# Patient Record
Sex: Female | Born: 1980 | Race: White | Hispanic: No | Marital: Single | Smoking: Current some day smoker
Health system: Southern US, Community
[De-identification: ages and names within clinical notes are randomized; demographics above are authoritative.]

## PROBLEM LIST (undated history)

## (undated) DIAGNOSIS — N6452 Nipple discharge: Secondary | ICD-10-CM

## (undated) DIAGNOSIS — N63 Unspecified lump in unspecified breast: Secondary | ICD-10-CM

---

## 2016-03-06 DIAGNOSIS — N63 Unspecified lump in unspecified breast: Secondary | ICD-10-CM

## 2016-03-06 HISTORY — DX: Unspecified lump in unspecified breast: N63.0

## 2017-03-09 ENCOUNTER — Other Ambulatory Visit: Payer: Self-pay | Admitting: Family Medicine

## 2017-03-09 DIAGNOSIS — N6312 Unspecified lump in the right breast, upper inner quadrant: Secondary | ICD-10-CM

## 2017-03-09 DIAGNOSIS — N6324 Unspecified lump in the left breast, lower inner quadrant: Secondary | ICD-10-CM

## 2017-03-11 ENCOUNTER — Other Ambulatory Visit: Payer: Self-pay | Admitting: Family Medicine

## 2017-03-11 DIAGNOSIS — N644 Mastodynia: Secondary | ICD-10-CM

## 2017-03-11 DIAGNOSIS — N6312 Unspecified lump in the right breast, upper inner quadrant: Secondary | ICD-10-CM

## 2017-03-11 DIAGNOSIS — N6324 Unspecified lump in the left breast, lower inner quadrant: Secondary | ICD-10-CM

## 2017-03-13 ENCOUNTER — Ambulatory Visit
Admission: RE | Admit: 2017-03-13 | Discharge: 2017-03-13 | Disposition: A | Discharge status: Home / Self Care | Source: Ambulatory Visit | Attending: Family Medicine | Admitting: Family Medicine

## 2017-03-13 DIAGNOSIS — N6312 Unspecified lump in the right breast, upper inner quadrant: Secondary | ICD-10-CM

## 2017-03-13 DIAGNOSIS — N644 Mastodynia: Secondary | ICD-10-CM

## 2017-03-13 DIAGNOSIS — N6324 Unspecified lump in the left breast, lower inner quadrant: Secondary | ICD-10-CM

## 2017-03-13 HISTORY — DX: Nipple discharge: N64.52

## 2017-03-13 HISTORY — DX: Unspecified lump in unspecified breast: N63.0

## 2018-05-22 ENCOUNTER — Encounter (HOSPITAL_COMMUNITY): Payer: Self-pay | Admitting: Emergency Medicine

## 2018-05-22 ENCOUNTER — Emergency Department (HOSPITAL_COMMUNITY)
Admission: EM | Admit: 2018-05-22 | Discharge: 2018-05-22 | Disposition: A | Discharge status: Home / Self Care | Attending: Emergency Medicine | Admitting: Emergency Medicine

## 2018-05-22 DIAGNOSIS — F17210 Nicotine dependence, cigarettes, uncomplicated: Secondary | ICD-10-CM | POA: Insufficient documentation

## 2018-05-22 DIAGNOSIS — K047 Periapical abscess without sinus: Secondary | ICD-10-CM | POA: Insufficient documentation

## 2018-05-22 DIAGNOSIS — K029 Dental caries, unspecified: Secondary | ICD-10-CM | POA: Diagnosis not present

## 2018-05-22 DIAGNOSIS — K0889 Other specified disorders of teeth and supporting structures: Secondary | ICD-10-CM | POA: Diagnosis present

## 2018-05-22 MED ORDER — PENICILLIN V POTASSIUM 500 MG PO TABS: 500.0000 mg | ORAL_TABLET | Freq: Four times a day (QID) | ORAL | 0 refills | Status: AC

## 2018-05-22 MED ORDER — IBUPROFEN 800 MG PO TABS: 800.0000 mg | ORAL_TABLET | Freq: Three times a day (TID) | ORAL | 0 refills | Status: DC

## 2018-05-22 MED ORDER — KETOROLAC TROMETHAMINE 30 MG/ML IJ SOLN
30.0000 mg | Freq: Once | INTRAMUSCULAR | Status: AC
  Administered 2018-05-22: 30 mg via INTRAMUSCULAR
  Filled 2018-05-22: qty 1

## 2018-05-22 NOTE — ED Triage Notes (Addendum)
Patient to ED c/o dental pain - states she has fillings that have come off and nerves are exposed x 1 month - worsening this past week with headache. Denies fevers/chills.

## 2018-05-22 NOTE — Discharge Instructions (Addendum)
Ibuprofen for pain. Penicillin for possible infection. Follow up with a dentist as soon as able.

## 2018-05-22 NOTE — ED Provider Notes (Signed)
[facility] EMERGENCY DEPARTMENT Provider Note   CSN: [id number] Arrival date & time: 05/22/18  4098     History   Chief Complaint Chief Complaint  Patient presents with  . Dental Pain    HPI Angel Fernandez is a 37 y.o. female.  HPI Angel Fernandez is a 38 y.o. female presents to ED with complaint of teeth pain. Pt states that she has had severe pain in all 4 last molars for about a month.  States that the feelings a lot of the one in the left upper gum.  States right upper second molar is decayed and is painful as well.  States on the lower jaw, both last molars are painful.  She states that she lost dental insurance and  unable to follow-up with a dentist at this time.  She states she is unable to sleep due to pain and has not been going to work for a week.  She is taking ibuprofen, Goody's powder, BC powder with no improvement.  She denies any facial swelling.  She denies any fever or chills.  No other complaints.  She states that she has been taking her mom's Vicodin which also does not help.  States "Percocet works better for me."  Past Medical History:  Diagnosis Date  . Breast discharge years   creamyin coloronly when squeezed discharge for years   . Breast mass 03/06/2016   bilat breat masses x's one week     There are no active problems to display for this patient.   History reviewed. No pertinent surgical history.   OB History   None      Home Medications    Prior to Admission medications   Not on File    Family History No family history on file.  Social History Social History   Tobacco Use  . Smoking status: Current Some Day Smoker    Packs/day: 0.25    Types: Cigarettes  Substance Use Topics  . Alcohol use: Yes  . Drug use: Never     Allergies   Erythromycin and Latex   Review of Systems Review of Systems  Constitutional: Negative for chills and fever.  HENT: Positive for dental problem.   Respiratory: Negative for  cough, chest tightness and shortness of breath.   Cardiovascular: Negative for chest pain, palpitations and leg swelling.  Musculoskeletal: Negative for arthralgias, myalgias, neck pain and neck stiffness.  Skin: Negative for rash.  Neurological: Positive for headaches. Negative for dizziness and weakness.  All other systems reviewed and are negative.    Physical Exam Updated Vital Signs BP 134/78 (BP Location: Left Arm)   Pulse 65   Temp 98 F (36.7 C) (Oral)   Resp 16   LMP 04/25/2018   SpO2 98%   Physical Exam  Constitutional: She is oriented to person, place, and time. She appears well-developed and well-nourished. No distress.  HENT:  Head: Normocephalic.  Right upper second molar decayed almost at the gumline.  No surrounding gum swelling or inflammation.  Left upper first molar chipped with large dental caries.  No trismus.  No swelling under the tongue.  Tenderness to palpation to the left first upper molar, left lower second molar, right upper second molar, right lower second molar.  Eyes: Conjunctivae are normal.  Neck: Neck supple.  Cardiovascular: Normal rate, regular rhythm and normal heart sounds.  Pulmonary/Chest: Effort normal and breath sounds normal. No respiratory distress. She has no wheezes. She has no rales.  Abdominal: Soft.  Bowel sounds are normal. She exhibits no distension. There is no tenderness. There is no rebound.  Musculoskeletal: She exhibits no edema.  Neurological: She is alert and oriented to person, place, and time.  Skin: Skin is warm and dry.  Psychiatric: She has a normal mood and affect. Her behavior is normal.  Nursing note and vitals reviewed.    ED Treatments / Results  Labs (all labs ordered are listed, but only abnormal results are displayed) Labs Reviewed - No data to display  EKG None  Radiology No results found.  Procedures Procedures (including critical care time)  Medications Ordered in ED Medications  ketorolac  (TORADOL) 30 MG/ML injection 30 mg (has no administration in time range)     Initial Impression / Assessment and Plan / ED Course  I have reviewed the triage vital signs and the nursing notes.  Pertinent labs & imaging results that were available during my care of the patient were reviewed by me and considered in my medical decision making (see chart for details).     Patient in emergency department with dental pain.  On exam, no obvious dental infection, multiple tender teeth.  No facial swelling.  No trismus.  No swelling under the tongue.  Patient is requesting Percocet, stating that she has tried NSAIDs, and has tried her mom's Vicodin which has not helped.  She states tramadol does not help her either and has not in the past.  She states Percocet is the only thing that works.  I expect to her that we do not normally treat dental pain with opiates.  I will encourage her to continue taking Tylenol and Motrin for pain.  I will start her on antibiotic for possible occult infection.  I will give her referral to a dentist.  Vitals:   05/22/18 0659 05/22/18 0856  BP: (!) 133/92 134/78  Pulse: 68 65  Resp: 16 16  Temp: 98.3 F (36.8 C) 98 F (36.7 C)  TempSrc: Oral Oral  SpO2: 100% 98%     Final Clinical Impressions(s) / ED Diagnoses   Final diagnoses:  Dental infection  Dental caries    ED Discharge Orders        Ordered    penicillin v potassium (VEETID) 500 MG tablet  4 times daily     05/22/18 0906    ibuprofen (ADVIL,MOTRIN) 800 MG tablet  3 times daily     05/22/18 0906       Jaynie CrumbleKirichenko, Kian Gamarra, PA-C 05/22/18 [id number]    Gwyneth SproutPlunkett, Whitney, MD 05/24/18 2249

## 2018-05-24 ENCOUNTER — Emergency Department (HOSPITAL_COMMUNITY)
Admission: EM | Admit: 2018-05-24 | Discharge: 2018-05-24 | Disposition: A | Discharge status: Home / Self Care | Attending: Emergency Medicine | Admitting: Emergency Medicine

## 2018-05-24 ENCOUNTER — Encounter (HOSPITAL_COMMUNITY): Payer: Self-pay | Admitting: *Deleted

## 2018-05-24 DIAGNOSIS — K0889 Other specified disorders of teeth and supporting structures: Secondary | ICD-10-CM | POA: Diagnosis present

## 2018-05-24 DIAGNOSIS — K029 Dental caries, unspecified: Secondary | ICD-10-CM

## 2018-05-24 DIAGNOSIS — F17210 Nicotine dependence, cigarettes, uncomplicated: Secondary | ICD-10-CM | POA: Diagnosis not present

## 2018-05-24 MED ORDER — OXYCODONE-ACETAMINOPHEN 5-325 MG PO TABS: 1.0000 | ORAL_TABLET | ORAL | 0 refills | Status: AC | PRN

## 2018-05-24 MED ORDER — OXYCODONE-ACETAMINOPHEN 5-325 MG PO TABS
1.0000 | ORAL_TABLET | ORAL | Status: DC | PRN
  Administered 2018-05-24: 1 via ORAL
  Filled 2018-05-24: qty 1

## 2018-05-24 NOTE — ED Triage Notes (Signed)
Pt in c/o dental pain, seen for same two nights ago, pain in due to continued pain

## 2018-05-24 NOTE — ED Notes (Signed)
Sign pad not working verbalized understanding of discharge instructions.  

## 2018-05-24 NOTE — ED Provider Notes (Signed)
[facility]GENC[facility] Provider Note   CSN: [id number] Arrival date & time: 05/24/18  1127     History   Chief Complaint Chief Complaint  Patient presents with  . Dental Pain    HPI Angel Fernandez is a 37 y.o. female with no significant pmhx who presented to the ED today complaining of dental pain. Pt reports that she has pain in multiple teeth due to nerve root exposure and cavities. She states that she was seen in the ED 2 days ago for similar symptoms and was given a shot of toradol which was not helpful. She was also given prescription for penicillin which she picked up today and took her first dose. She states that she has called around to multiple free dental clinics but they are unable to see her. She had an appointment with an oral surgeon today and they told her that they could pull her teeth but pt reported that she was "not ready to do that yet". She states that she was told that she needed to see a general dentist if she wanted different medical management. She has been taking ibuprofen at home without relief. No reported fever, chills, dysphagia, sore throat, oral swelling.  HPI  Past Medical History:  Diagnosis Date  . Breast discharge years   creamyin coloronly when squeezed discharge for years   . Breast mass 03/06/2016   bilat breat masses x's one week     There are no active problems to display for this patient.   History reviewed. No pertinent surgical history.   OB History   None      Home Medications    Prior to Admission medications   Medication Sig Start Date End Date Taking? Authorizing Provider  ibuprofen (ADVIL,MOTRIN) 800 MG tablet Take 1 tablet (800 mg total) by mouth 3 (three) times daily. 05/22/18   Kirichenko, Lemont Fillersatyana, PA-C  oxyCODONE-acetaminophen (PERCOCET/ROXICET) 5-325 MG tablet Take 1 tablet by mouth every 4 (four) hours as needed for severe pain. 05/24/18   Dowless, Lelon MastSamantha Tripp, PA-C  penicillin v potassium  (VEETID) 500 MG tablet Take 1 tablet (500 mg total) by mouth 4 (four) times daily for 7 days. 05/22/18 05/29/18  Jaynie CrumbleKirichenko, Tatyana, PA-C    Family History History reviewed. No pertinent family history.  Social History Social History   Tobacco Use  . Smoking status: Current Some Day Smoker    Packs/day: 0.25    Types: Cigarettes  . Smokeless tobacco: Current User  Substance Use Topics  . Alcohol use: Yes  . Drug use: Never     Allergies   Erythromycin and Latex   Review of Systems Review of Systems  All other systems reviewed and are negative.    Physical Exam Updated Vital Signs BP 138/88   Pulse 78   Temp 98.3 F (36.8 C) (Oral)   Resp 18   LMP 04/25/2018   SpO2 100%   Physical Exam  Constitutional: She is oriented to person, place, and time. She appears well-developed and well-nourished. No distress.  HENT:  Head: Normocephalic and atraumatic.  Mouth/Throat: Mucous membranes are normal. Abnormal dentition. Dental caries present. No dental abscesses. No oropharyngeal exudate or tonsillar abscesses.  Multiple dental caries without obvious dental abscess  Eyes: Conjunctivae are normal. Right eye exhibits no discharge. Left eye exhibits no discharge. No scleral icterus.  Cardiovascular: Normal rate.  Pulmonary/Chest: Effort normal.  Neurological: She is alert and oriented to person, place, and time. Coordination normal.  Skin: Skin is  warm and dry. No rash noted. She is not diaphoretic. No erythema. No pallor.  Psychiatric: She has a normal mood and affect. Her behavior is normal.  Nursing note and vitals reviewed.    ED Treatments / Results  Labs (all labs ordered are listed, but only abnormal results are displayed) Labs Reviewed - No data to display  EKG None  Radiology No results found.  Procedures Procedures (including critical care time)  Medications Ordered in ED Medications  oxyCODONE-acetaminophen (PERCOCET/ROXICET) 5-325 MG per tablet 1  tablet (1 tablet Oral Given 05/24/18 1222)     Initial Impression / Assessment and Plan / ED Course  I have reviewed the triage vital signs and the nursing notes.  Pertinent labs & imaging results that were available during my care of the patient were reviewed by me and considered in my medical decision making (see chart for details).    Patient with toothache in multiple teeth due to dental aries. No gross abscess. Exam unconcerning for Ludwig's angina or spread of infection. Pt has prescription for penicillin that she started today. I will give her another referral to a general family dentist. Few doses of oral pain medicine given. Return precautions outlined in patient discharge instructions.    Final Clinical Impressions(s) / ED Diagnoses   Final diagnoses:  Pain due to dental caries    ED Discharge Orders        Ordered    oxyCODONE-acetaminophen (PERCOCET/ROXICET) 5-325 MG tablet  Every 4 hours PRN     05/24/18 1310       Dowless, [place], PA-C 05/24/18 1518    Gerhard Munch, MD 05/24/18 1622

## 2018-05-24 NOTE — Discharge Instructions (Signed)
Follow up with any adult dentist as listed below. Take percocet as needed for pain as well as penicillin given during last visit. Return to the ED if you experience severe worsening of your symptoms, fever, difficulty swallowing.

## 2018-05-24 NOTE — ED Notes (Signed)
Patient brought discharge paper work with and stated called multiple placed and unable to get appointment today or tomorrow.

## 2018-06-16 ENCOUNTER — Other Ambulatory Visit: Payer: Self-pay

## 2018-06-16 ENCOUNTER — Emergency Department (HOSPITAL_COMMUNITY)
Admission: EM | Admit: 2018-06-16 | Discharge: 2018-06-16 | Disposition: A | Discharge status: Home / Self Care | Attending: Emergency Medicine | Admitting: Emergency Medicine

## 2018-06-16 ENCOUNTER — Emergency Department (HOSPITAL_COMMUNITY)

## 2018-06-16 DIAGNOSIS — F17210 Nicotine dependence, cigarettes, uncomplicated: Secondary | ICD-10-CM | POA: Diagnosis not present

## 2018-06-16 DIAGNOSIS — Y929 Unspecified place or not applicable: Secondary | ICD-10-CM | POA: Diagnosis not present

## 2018-06-16 DIAGNOSIS — W2209XA Striking against other stationary object, initial encounter: Secondary | ICD-10-CM | POA: Diagnosis not present

## 2018-06-16 DIAGNOSIS — Z23 Encounter for immunization: Secondary | ICD-10-CM | POA: Diagnosis not present

## 2018-06-16 DIAGNOSIS — S0101XA Laceration without foreign body of scalp, initial encounter: Secondary | ICD-10-CM | POA: Diagnosis not present

## 2018-06-16 DIAGNOSIS — R42 Dizziness and giddiness: Secondary | ICD-10-CM | POA: Diagnosis present

## 2018-06-16 DIAGNOSIS — Y939 Activity, unspecified: Secondary | ICD-10-CM | POA: Diagnosis not present

## 2018-06-16 DIAGNOSIS — Z79899 Other long term (current) drug therapy: Secondary | ICD-10-CM | POA: Insufficient documentation

## 2018-06-16 DIAGNOSIS — Y999 Unspecified external cause status: Secondary | ICD-10-CM | POA: Diagnosis not present

## 2018-06-16 DIAGNOSIS — S0191XA Laceration without foreign body of unspecified part of head, initial encounter: Secondary | ICD-10-CM

## 2018-06-16 MED ORDER — TETANUS-DIPHTH-ACELL PERTUSSIS 5-2.5-18.5 LF-MCG/0.5 IM SUSP
0.5000 mL | Freq: Once | INTRAMUSCULAR | Status: AC
  Administered 2018-06-16: 0.5 mL via INTRAMUSCULAR
  Filled 2018-06-16: qty 0.5

## 2018-06-16 MED ORDER — KETOROLAC TROMETHAMINE 30 MG/ML IJ SOLN
30.0000 mg | Freq: Once | INTRAMUSCULAR | Status: AC
  Administered 2018-06-16: 30 mg via INTRAMUSCULAR
  Filled 2018-06-16: qty 1

## 2018-06-16 MED ORDER — LIDOCAINE-EPINEPHRINE (PF) 2 %-1:200000 IJ SOLN
20.0000 mL | Freq: Once | INTRAMUSCULAR | Status: AC
  Administered 2018-06-16: 20 mL
  Filled 2018-06-16: qty 20

## 2018-06-16 MED ORDER — ETODOLAC 300 MG PO CAPS: 300.0000 mg | ORAL_CAPSULE | Freq: Three times a day (TID) | ORAL | 0 refills | Status: AC

## 2018-06-16 MED ORDER — IBUPROFEN 200 MG PO TABS
600.0000 mg | ORAL_TABLET | Freq: Once | ORAL | Status: DC
  Filled 2018-06-16: qty 3

## 2018-06-16 NOTE — Discharge Instructions (Addendum)
Staple removal in 7 to 10 days.

## 2018-06-16 NOTE — ED Notes (Signed)
Patient transported to X-ray 

## 2018-06-16 NOTE — ED Provider Notes (Signed)
[facility]-EMERGENCY DEPT Provider Note   CSN: [id number] Arrival date & time: 06/16/18  1657     History   Chief Complaint Chief Complaint  Patient presents with  . Head Laceration  . Dizziness    HPI Angel Fernandez is a 37 y.o. female.  HPI Patient presented to the emergency room for evaluation of a head injury.  Patient states she was standing up today when she struck her head on an open cabinet.  Patient sustained a laceration on top of her head.  She had a brief episode of loss of consciousness and fell to the ground.  Patient is now complaining of pain in her head and her neck.  She also has some pain in her shoulder but that has been a more chronic issue per.  Patient also states she is having continued dental pain.  She was seen previously and started on antibiotics.  She was referred to a dentist.  She denies any fevers or chills. Past Medical History:  Diagnosis Date  . Breast discharge years   creamyin coloronly when squeezed discharge for years   . Breast mass 03/06/2016   bilat breat masses x's one week     There are no active problems to display for this patient.   No past surgical history on file.   OB History   None      Home Medications    Prior to Admission medications   Medication Sig Start Date End Date Taking? Authorizing Provider  divalproex (DEPAKOTE) 500 MG DR tablet Take 1,000 mg by mouth 2 (two) times daily.   Yes [provider]  traMADol (ULTRAM) 50 MG tablet Take 50 mg by mouth every 6 (six) hours as needed for pain. 08/16/15  Yes [provider]  etodolac (LODINE) 300 MG capsule Take 1 capsule (300 mg total) by mouth every 8 (eight) hours. 06/16/18   Linwood Dibbles, MD  oxyCODONE-acetaminophen (PERCOCET/ROXICET) 5-325 MG tablet Take 1 tablet by mouth every 4 (four) hours as needed for severe pain. 05/24/18   Dowless, Lester Kinsman, PA-C    Family History No family history on file.  Social  History Social History   Tobacco Use  . Smoking status: Current Some Day Smoker    Packs/day: 0.25    Types: Cigarettes  . Smokeless tobacco: Current User  Substance Use Topics  . Alcohol use: Yes  . Drug use: Never     Allergies   Erythromycin; Latex; and Cleocin [clindamycin]   Review of Systems Review of Systems  All other systems reviewed and are negative.    Physical Exam Updated Vital Signs BP (!) 137/93   Pulse 69   Temp 98.8 F (37.1 C) (Oral)   Resp (!) 26   Ht 1.6 m (5\' 3" )   Wt 70.8 kg (156 lb)   SpO2 100%   BMI 27.63 kg/m   Physical Exam  HENT:  Head: Normocephalic.  Right Ear: External ear normal.  Left Ear: External ear normal.  Laceration scalp  Eyes: Conjunctivae are normal. Right eye exhibits no discharge. Left eye exhibits no discharge. No scleral icterus.  Neck: Neck supple. No tracheal deviation present.  Cardiovascular: Normal rate, regular rhythm and intact distal pulses.  Pulmonary/Chest: Effort normal and breath sounds normal. No stridor. No respiratory distress. She has no wheezes. She has no rales.  Abdominal: Soft. Bowel sounds are normal. She exhibits no distension. There is no tenderness. There is no rebound and no guarding.  Musculoskeletal: She  exhibits tenderness. She exhibits no edema.       Right shoulder: She exhibits tenderness.  Neurological: She is alert. She has normal strength. No cranial nerve deficit (no facial droop, extraocular movements intact, no slurred speech) or sensory deficit. She exhibits normal muscle tone. She displays no seizure activity. Coordination normal.  Skin: Skin is warm and dry. No rash noted. She is not diaphoretic.  Psychiatric: She has a normal mood and affect.  Nursing note and vitals reviewed.    ED Treatments / Results  Labs (all labs ordered are listed, but only abnormal results are displayed) Labs Reviewed - No data to display  EKG None  Radiology Dg Shoulder Right  Result Date:  06/16/2018 CLINICAL DATA:  Shoulder injury, pain, initial encounter. EXAM: RIGHT SHOULDER - 2+ VIEW COMPARISON:  None. FINDINGS: No acute osseous or joint abnormality. Visualized portion of the right chest is unremarkable. IMPRESSION: Negative. Electronically Signed   By: Leanna Battles M.D.   On: 06/16/2018 18:06   Ct Head Wo Contrast  Result Date: 06/16/2018 CLINICAL DATA:  Head laceration and dizziness. Patient hit head against the cabinet. Questionable loss of consciousness. EXAM: CT HEAD WITHOUT CONTRAST CT CERVICAL SPINE WITHOUT CONTRAST TECHNIQUE: Multidetector CT imaging of the head and cervical spine was performed following the standard protocol without intravenous contrast. Multiplanar CT image reconstructions of the cervical spine were also generated. COMPARISON:  None. FINDINGS: CT HEAD FINDINGS BRAIN: The ventricles and sulci are normal. No intraparenchymal hemorrhage, mass effect nor midline shift. No acute large vascular territory infarcts. No abnormal extra-axial fluid collections. Basal cisterns are midline and not effaced. No acute cerebellar abnormality. VASCULAR: Unremarkable. SKULL/SOFT TISSUES: No skull fracture. No significant soft tissue swelling. ORBITS/SINUSES: The included ocular globes and orbital contents are normal.Moderate ethmoid sinus mucosal thickening. Polypoid mucosal thickening of the right maxillary antrum with mild circumferential mucosal thickening of the left maxillary sinus. Mastoids are clear. OTHER: Scalp laceration with associated mild soft tissue swelling overlying the vertex of the skull posteriorly. CT CERVICAL SPINE FINDINGS ALIGNMENT: Vertebral bodies in alignment. Slight reversal cervical lordosis which may be on the basis of muscle spasm or patient positioning. SKULL BASE AND VERTEBRAE: Cervical vertebral bodies and posterior elements are intact. Intervertebral disc heights preserved. No destructive bony lesions. C1-2 articulation maintained. SOFT TISSUES AND  SPINAL CANAL: Normal. DISC LEVELS: No significant osseous canal stenosis or neural foraminal narrowing. UPPER CHEST: Lung apices are clear. OTHER: None. IMPRESSION: 1. Scalp laceration near the vertex of the skull without underlying fracture. No acute intracranial abnormality. 2. No acute cervical spine fracture or posttraumatic listhesis. Electronically Signed   By: Tollie Eth M.D.   On: 06/16/2018 18:26   Ct Cervical Spine Wo Contrast  Result Date: 06/16/2018 CLINICAL DATA:  Head laceration and dizziness. Patient hit head against the cabinet. Questionable loss of consciousness. EXAM: CT HEAD WITHOUT CONTRAST CT CERVICAL SPINE WITHOUT CONTRAST TECHNIQUE: Multidetector CT imaging of the head and cervical spine was performed following the standard protocol without intravenous contrast. Multiplanar CT image reconstructions of the cervical spine were also generated. COMPARISON:  None. FINDINGS: CT HEAD FINDINGS BRAIN: The ventricles and sulci are normal. No intraparenchymal hemorrhage, mass effect nor midline shift. No acute large vascular territory infarcts. No abnormal extra-axial fluid collections. Basal cisterns are midline and not effaced. No acute cerebellar abnormality. VASCULAR: Unremarkable. SKULL/SOFT TISSUES: No skull fracture. No significant soft tissue swelling. ORBITS/SINUSES: The included ocular globes and orbital contents are normal.Moderate ethmoid sinus mucosal thickening. Polypoid mucosal thickening  of the right maxillary antrum with mild circumferential mucosal thickening of the left maxillary sinus. Mastoids are clear. OTHER: Scalp laceration with associated mild soft tissue swelling overlying the vertex of the skull posteriorly. CT CERVICAL SPINE FINDINGS ALIGNMENT: Vertebral bodies in alignment. Slight reversal cervical lordosis which may be on the basis of muscle spasm or patient positioning. SKULL BASE AND VERTEBRAE: Cervical vertebral bodies and posterior elements are intact.  Intervertebral disc heights preserved. No destructive bony lesions. C1-2 articulation maintained. SOFT TISSUES AND SPINAL CANAL: Normal. DISC LEVELS: No significant osseous canal stenosis or neural foraminal narrowing. UPPER CHEST: Lung apices are clear. OTHER: None. IMPRESSION: 1. Scalp laceration near the vertex of the skull without underlying fracture. No acute intracranial abnormality. 2. No acute cervical spine fracture or posttraumatic listhesis. Electronically Signed   By: Tollie Ethavid  Kwon M.D.   On: 06/16/2018 18:26    Procedures .Marland Kitchen.Laceration Repair Date/Time: 06/16/2018 7:11 PM Performed by: Linwood DibblesKnapp, Sanayah Munro, MD Authorized by: Linwood DibblesKnapp, Jaice Lague, MD   Consent:    Consent obtained:  Verbal   Consent given by:  Patient   Risks discussed:  Infection, need for additional repair, pain, poor cosmetic result and poor wound healing   Alternatives discussed:  No treatment and delayed treatment Universal protocol:    Procedure explained and questions answered to patient or proxy's satisfaction: yes     Relevant documents present and verified: yes     Test results available and properly labeled: yes     Imaging studies available: yes     Required blood products, implants, devices, and special equipment available: yes     Site/side marked: yes     Immediately prior to procedure, a time out was called: yes     Patient identity confirmed:  Verbally with patient Anesthesia (see MAR for exact dosages):    Anesthesia method:  Local infiltration Laceration details:    Location:  Scalp   Scalp location:  Mid-scalp   Length (cm):  2 Repair type:    Repair type:  Simple Pre-procedure details:    Preparation:  Patient was prepped and draped in usual sterile fashion Exploration:    Wound extent: no areolar tissue violation noted, no fascia violation noted, no foreign bodies/material noted, no muscle damage noted, no nerve damage noted, no tendon damage noted, no underlying fracture noted and no vascular damage noted      Contaminated: no   Treatment:    Area cleansed with:  Shur-Clens   Amount of cleaning:  Standard   Irrigation method:  Pressure wash   Visualized foreign bodies/material removed: no   Skin repair:    Repair method:  Staples   Number of staples:  4 Approximation:    Approximation:  Close Post-procedure details:    Dressing:  Open (no dressing)   Patient tolerance of procedure:  Tolerated well, no immediate complications   (including critical care time)  Medications Ordered in ED Medications  ibuprofen (ADVIL,MOTRIN) tablet 600 mg (600 mg Oral Refused 06/16/18 1909)  lidocaine-EPINEPHrine (XYLOCAINE W/EPI) 2 %-1:200000 (PF) injection 20 mL (20 mLs Infiltration Given 06/16/18 1847)  ketorolac (TORADOL) 30 MG/ML injection 30 mg (30 mg Intramuscular Given 06/16/18 1852)     Initial Impression / Assessment and Plan / ED Course  I have reviewed the triage vital signs and the nursing notes.  Pertinent labs & imaging results that were available during my care of the patient were reviewed by me and considered in my medical decision making (see chart for details).  Patient presented to the emergency room for evaluation of a head injury associated with a syncopal event.  Patient had a laceration on the top of her scalp.  CT scans were negative for acute injury.  Patient also mentioned having some shoulder pain that was actually preceding this event.  X-rays were performed no acute injuries noted.  Patient's laceration was repaired without difficulty.  At this time there does not appear to be any evidence of an acute emergency medical condition and the patient appears stable for discharge with appropriate outpatient follow up.   Final Clinical Impressions(s) / ED Diagnoses   Final diagnoses:  Laceration of head without foreign body, unspecified part of head, initial encounter    ED Discharge Orders        Ordered    etodolac (LODINE) 300 MG capsule  Every 8 hours    Note to Pharmacy:   As needed for pain   06/16/18 1909       Linwood Dibbles, MD 06/16/18 1912

## 2018-06-16 NOTE — ED Triage Notes (Signed)
Pt to ED from home with c/o of head laceration and dizziness. Pt was bending down at work and the cabinet above her was open, as she stood up she hit her head on the cabinet. Pt states she thinks she lost consciousness because she just remembers waking up in the fetal position. Pt states she is feeling nausea and has "thrown up in her mouth 3 x" Pt also states she has a few nerves exposed in her mouth for dental issues and on antibiotics for 3 weeks.

## 2019-09-04 IMAGING — CT CT CERVICAL SPINE W/O CM
4 of 7 series · 13 of 33 positions shown, 14 images · non-contrast
Comparison: None.

CLINICAL DATA: Head laceration and dizziness. Patient hit head
against the cabinet. Questionable loss of consciousness.

EXAM:
CT HEAD WITHOUT CONTRAST
CT CERVICAL SPINE WITHOUT CONTRAST
TECHNIQUE: Multidetector CT imaging of the head and cervical spine was
performed following the standard protocol without intravenous
contrast. Multiplanar CT image reconstructions of the cervical spine
were also generated.

[Series 8: c spine soft · axial · 0.29mm/px · z∈[-218,-118]mm · 4 of 84 slices shown]
[im 17/84  soft-tissue]
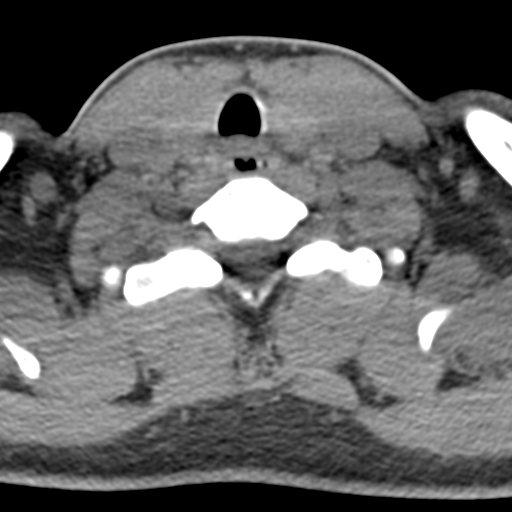
[im 34/84  soft-tissue]
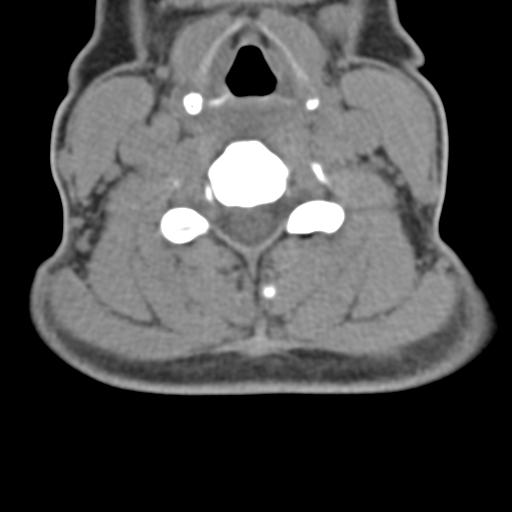
[im 50/84  soft-tissue]
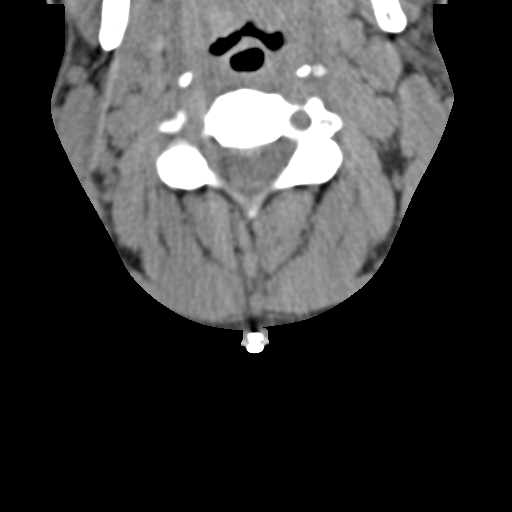
[im 67/84  soft-tissue]
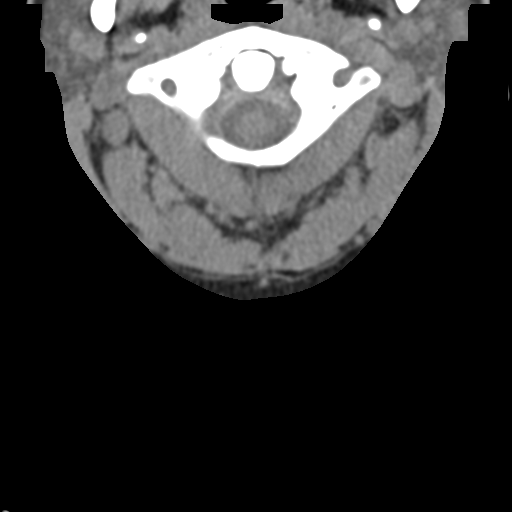

[Series 10: orthogonal bone · axial · 0.23mm/px · z∈[-223,-133]mm · 4 of 82 slices shown, 5 images]
[im 17/82  soft-tissue]
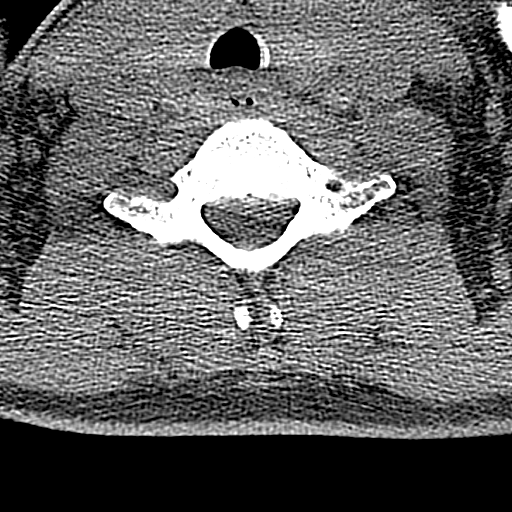
[im 17/82  bone]
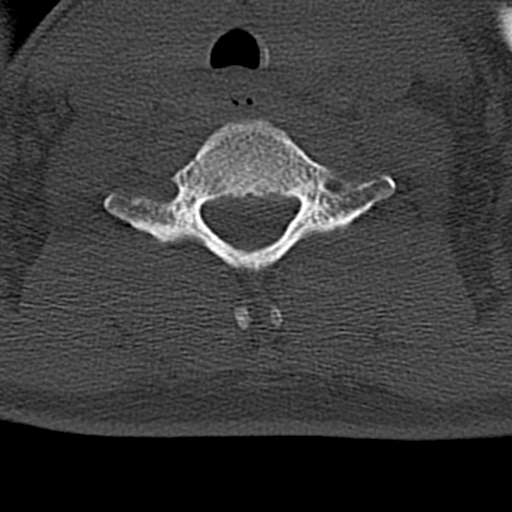
[im 33/82  bone]
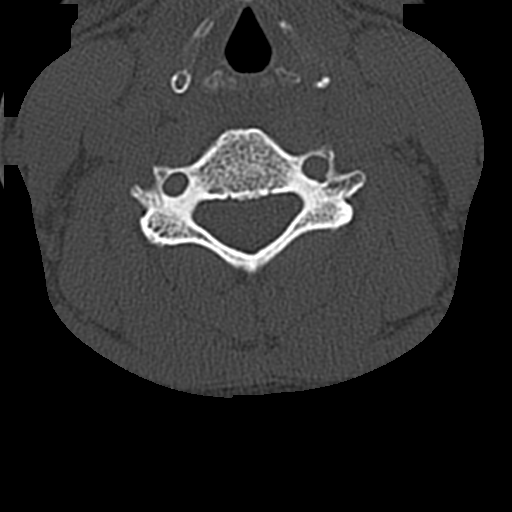
[im 49/82  bone]
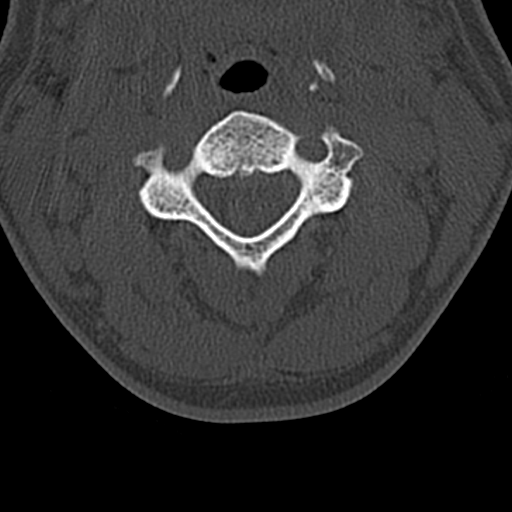
[im 65/82  bone]
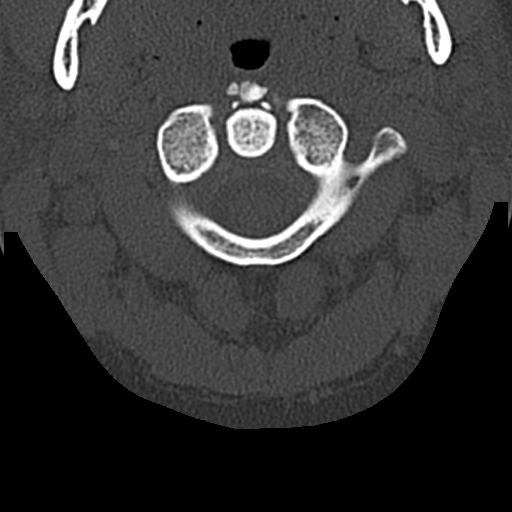

[Series 11: coronal bone · coronal · 0.24mm/px · 1 of 61 slices shown]
[im 31/61  bone]
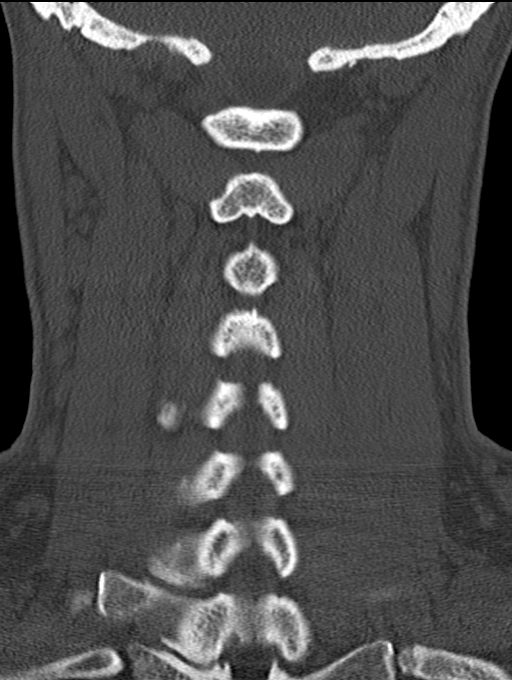

[Series 12: sagittal bone · sagittal · 0.24mm/px · 4 of 61 slices shown]
[im 13/61  bone]
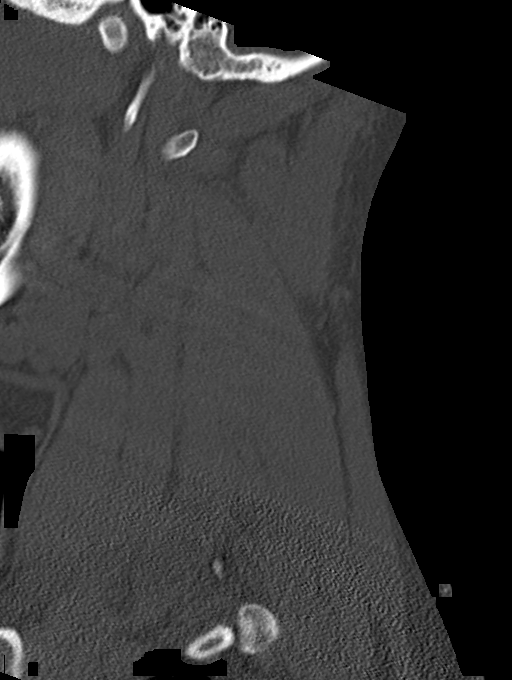
[im 25/61  bone]
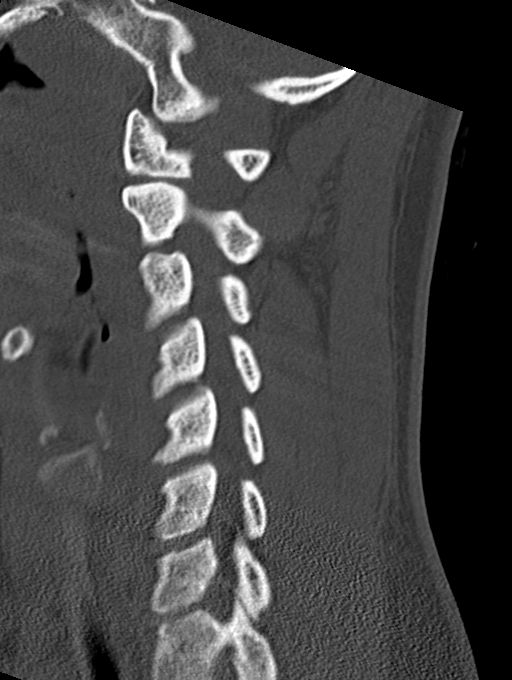
[im 37/61  bone]
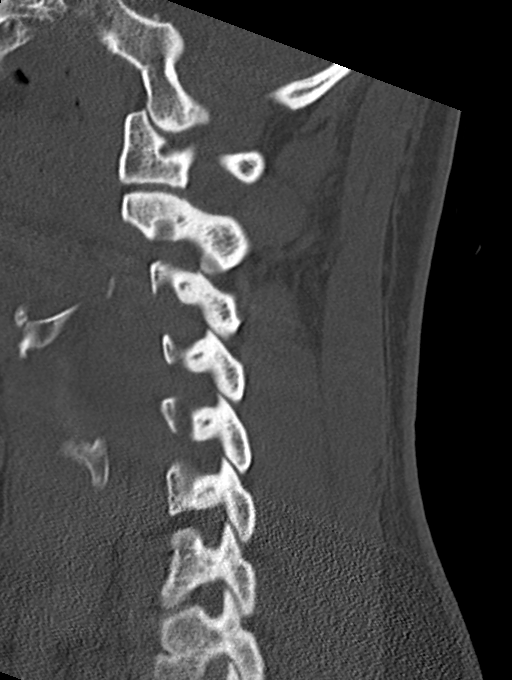
[im 49/61  bone]
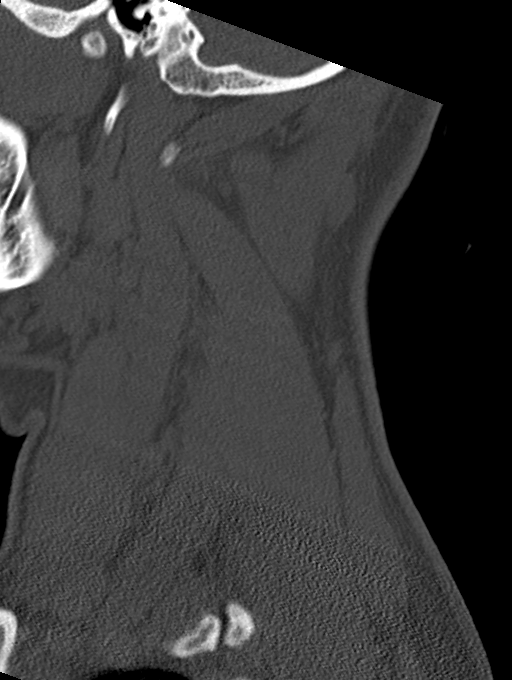

[13 of 33 positions shown; findings below may reference images not displayed]

FINDINGS: CT HEAD FINDINGS

BRAIN: The ventricles and sulci are normal. No intraparenchymal
hemorrhage, mass effect nor midline shift. No acute large vascular
territory infarcts. No abnormal extra-axial fluid collections. Basal
cisterns are midline and not effaced. No acute cerebellar
abnormality.

VASCULAR: Unremarkable.

SKULL/SOFT TISSUES: No skull fracture. No significant soft tissue
swelling.

ORBITS/SINUSES: The included ocular globes and orbital contents are
normal.Moderate ethmoid sinus mucosal thickening. Polypoid mucosal
thickening of the right maxillary antrum with mild circumferential
mucosal thickening of the left maxillary sinus. Mastoids are clear.

OTHER: Scalp laceration with associated mild soft tissue swelling
overlying the vertex of the skull posteriorly.

CT CERVICAL SPINE FINDINGS

ALIGNMENT: Vertebral bodies in alignment. Slight reversal cervical
lordosis which may be on the basis of muscle spasm or patient
positioning.

SKULL BASE AND VERTEBRAE: Cervical vertebral bodies and posterior
elements are intact. Intervertebral disc heights preserved. No
destructive bony lesions. C1-2 articulation maintained.

SOFT TISSUES AND SPINAL CANAL: Normal.

DISC LEVELS: No significant osseous canal stenosis or neural
foraminal narrowing.

UPPER CHEST: Lung apices are clear.

OTHER: None.
IMPRESSION: 1. Scalp laceration near the vertex of the skull without underlying
fracture. No acute intracranial abnormality.
2. No acute cervical spine fracture or posttraumatic listhesis.

## 2019-09-04 IMAGING — CR DG SHOULDER 2+V*R*
3 series · 3 of 3 positions shown · non-contrast
Comparison: None.

CLINICAL DATA: Shoulder injury, pain, initial encounter.

EXAM:
RIGHT SHOULDER - 2+ VIEW

[x shoulder ap right (1 of 2)]
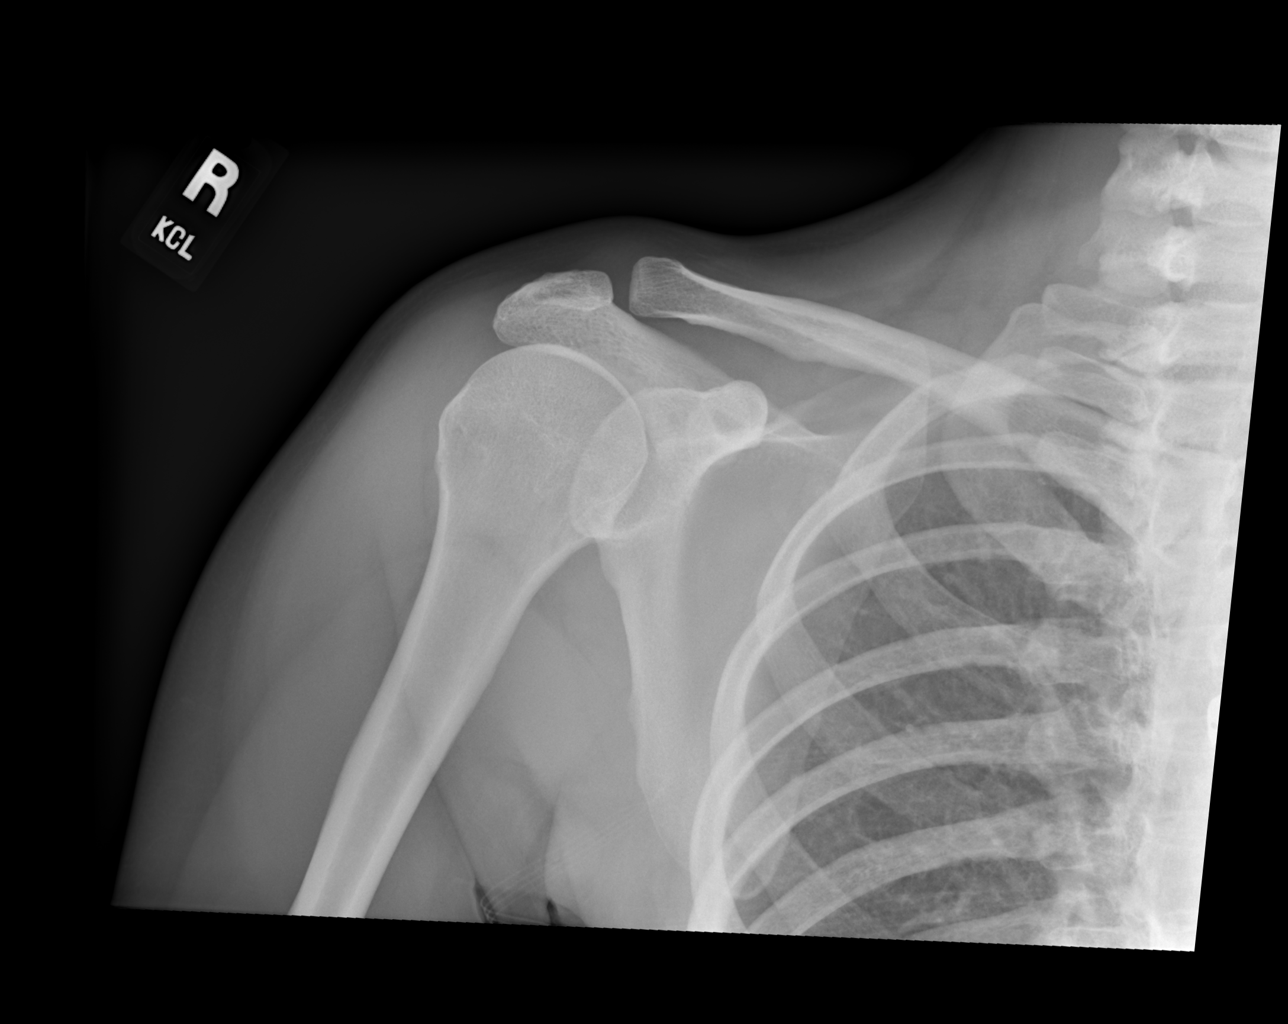

[x shoulder ap right (2 of 2)]
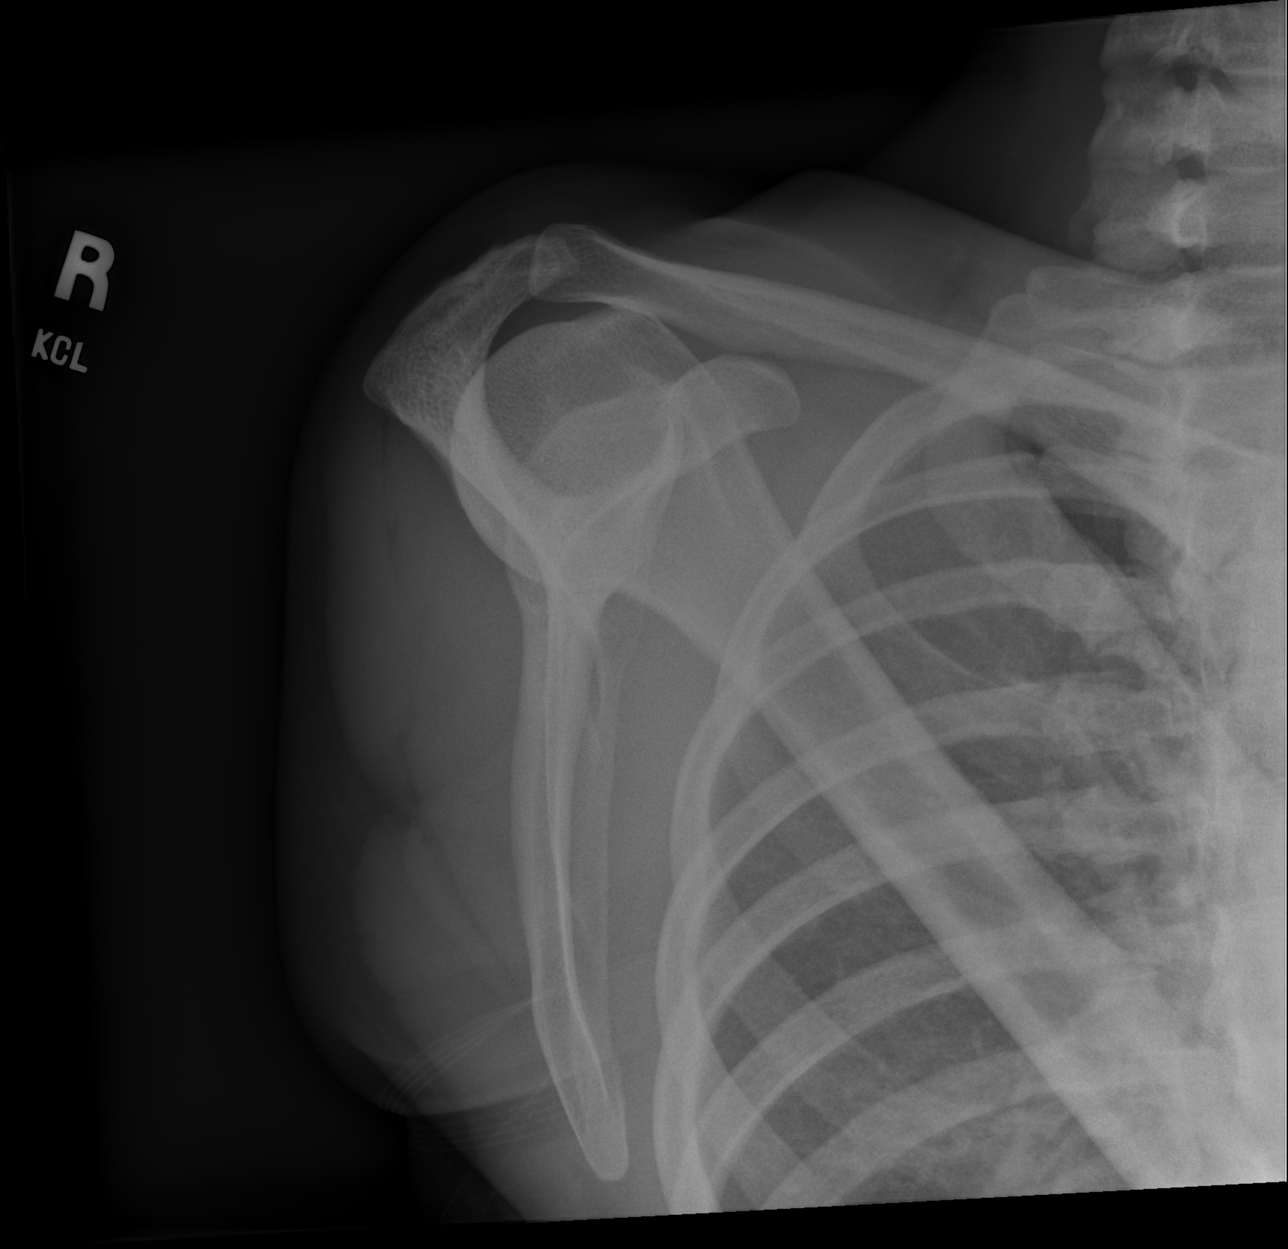

[x shoulder axillary right]
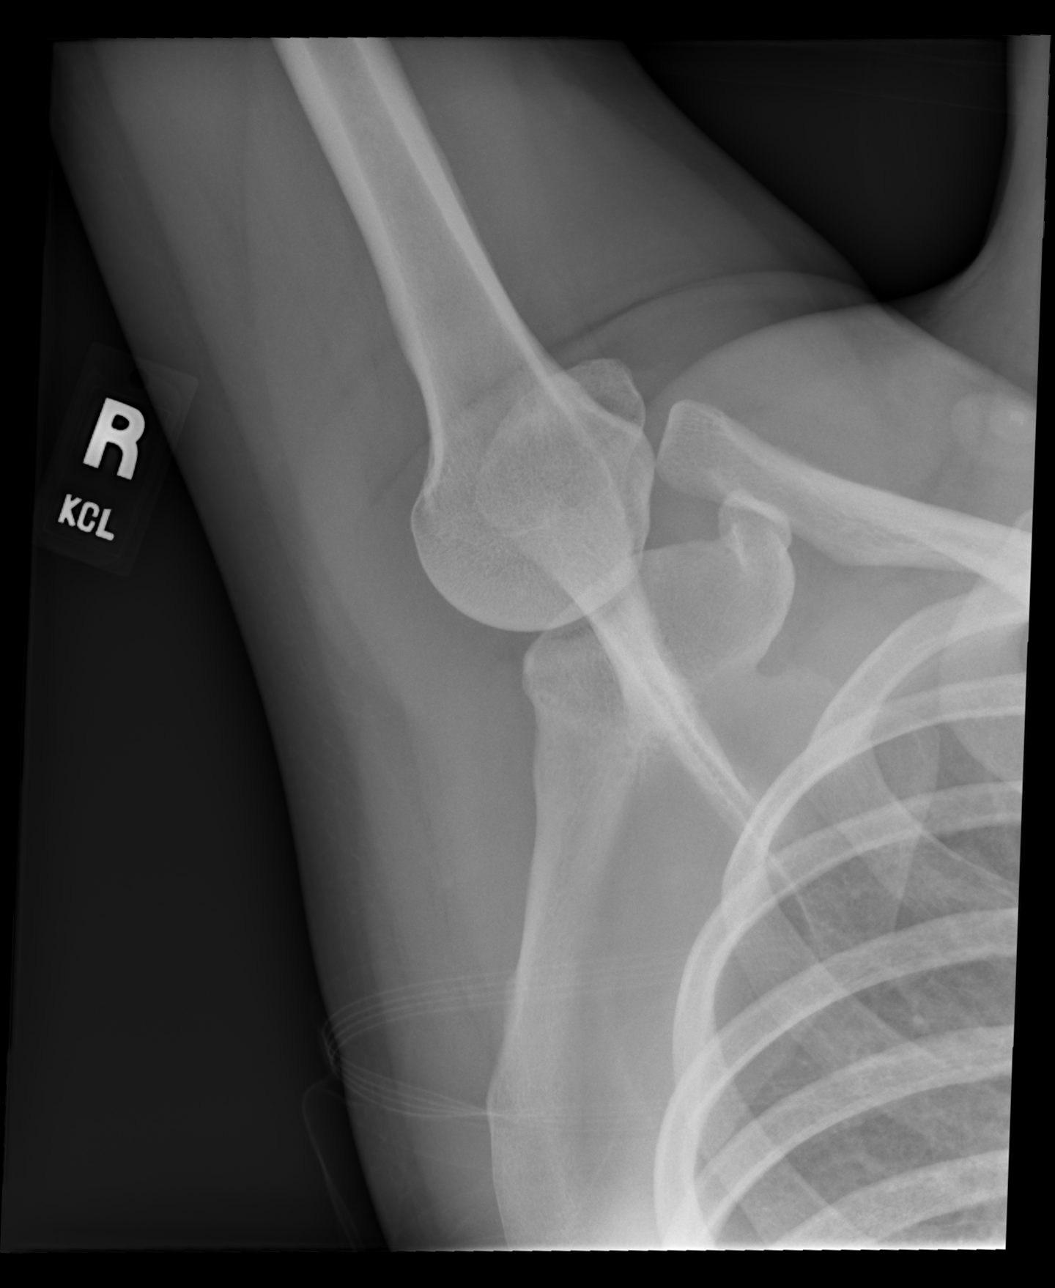

[3 of 3 positions shown; findings below may reference images not displayed]

FINDINGS: No acute osseous or joint abnormality. Visualized portion of the
right chest is unremarkable.
IMPRESSION: Negative.

## 2020-04-02 ENCOUNTER — Emergency Department (HOSPITAL_COMMUNITY)
Admission: EM | Admit: 2020-04-02 | Discharge: 2020-04-03 | Disposition: A | Discharge status: Home / Self Care | Attending: Emergency Medicine | Admitting: Emergency Medicine

## 2020-04-02 ENCOUNTER — Other Ambulatory Visit: Payer: Self-pay

## 2020-04-02 DIAGNOSIS — Z79899 Other long term (current) drug therapy: Secondary | ICD-10-CM | POA: Insufficient documentation

## 2020-04-02 DIAGNOSIS — F17210 Nicotine dependence, cigarettes, uncomplicated: Secondary | ICD-10-CM | POA: Diagnosis not present

## 2020-04-02 DIAGNOSIS — T7840XA Allergy, unspecified, initial encounter: Secondary | ICD-10-CM | POA: Insufficient documentation

## 2020-04-02 DIAGNOSIS — Z91040 Latex allergy status: Secondary | ICD-10-CM | POA: Diagnosis not present

## 2020-04-02 DIAGNOSIS — R21 Rash and other nonspecific skin eruption: Secondary | ICD-10-CM | POA: Diagnosis not present

## 2020-04-02 NOTE — ED Triage Notes (Signed)
Patient arrived stating she is having pain around her head and neck after using hair dye two days ago.

## 2020-04-02 NOTE — ED Notes (Signed)
Patient returned to desk asking about her wait. This nurse informed patient was back in the department.

## 2020-04-03 MED ORDER — FAMOTIDINE 20 MG PO TABS
20.0000 mg | ORAL_TABLET | Freq: Once | ORAL | Status: AC
  Administered 2020-04-03: 20 mg via ORAL
  Filled 2020-04-03: qty 1

## 2020-04-03 MED ORDER — HYDROCODONE-ACETAMINOPHEN 5-325 MG PO TABS
1.0000 | ORAL_TABLET | Freq: Once | ORAL | Status: AC
  Administered 2020-04-03: 01:00:00 1 via ORAL
  Filled 2020-04-03: qty 1

## 2020-04-03 MED ORDER — HYDROCODONE-ACETAMINOPHEN 5-325 MG PO TABS: 1.0000 | ORAL_TABLET | ORAL | 0 refills | Status: AC | PRN

## 2020-04-03 MED ORDER — FAMOTIDINE IN NACL 20-0.9 MG/50ML-% IV SOLN
20.0000 mg | Freq: Once | INTRAVENOUS | Status: DC
  Filled 2020-04-03: qty 50

## 2020-04-03 MED ORDER — FAMOTIDINE 20 MG PO TABS: 20.0000 mg | ORAL_TABLET | Freq: Two times a day (BID) | ORAL | 0 refills | Status: AC

## 2020-04-03 MED ORDER — DEXAMETHASONE SODIUM PHOSPHATE 10 MG/ML IJ SOLN
10.0000 mg | Freq: Once | INTRAMUSCULAR | Status: AC
Start: 2020-04-03 — End: 2020-04-03
  Administered 2020-04-03: 10 mg via INTRAVENOUS
  Filled 2020-04-03: qty 1

## 2020-04-03 MED ORDER — DIPHENHYDRAMINE HCL 50 MG/ML IJ SOLN
25.0000 mg | Freq: Once | INTRAMUSCULAR | Status: AC
  Administered 2020-04-03: 01:00:00 25 mg via INTRAVENOUS
  Filled 2020-04-03: qty 1

## 2020-04-03 MED ORDER — DIPHENHYDRAMINE HCL 25 MG PO TABS: 25.0000 mg | ORAL_TABLET | Freq: Four times a day (QID) | ORAL | 0 refills | Status: AC | PRN

## 2020-04-03 NOTE — ED Provider Notes (Signed)
[facility] DEPT Provider Note   CSN: [id number] Arrival date & time: 04/02/20  1758     History Chief Complaint  Patient presents with  . Allergic Reaction    Angel Fernandez is a 39 y.o. female.  Patient to ED with complaint of scalp rash that started after using a hair coloring agent 2 days ago. She reports significant itching and pain. There are areas that are draining fluid. No fever. She tried taking Benadryl yesterday and did not get any significant relief. No other symptoms.   The history is provided by the patient. No language interpreter was used.  Allergic Reaction Presenting symptoms: rash        Past Medical History:  Diagnosis Date  . Breast discharge years   creamyin coloronly when squeezed discharge for years   . Breast mass 03/06/2016   bilat breat masses x's one week     There are no problems to display for this patient.   No past surgical history on file.   OB History   No obstetric history on file.     No family history on file.  Social History   Tobacco Use  . Smoking status: Current Some Day Smoker    Packs/day: 0.25    Types: Cigarettes  . Smokeless tobacco: Current User  Substance Use Topics  . Alcohol use: Yes  . Drug use: Never    Home Medications Prior to Admission medications   Medication Sig Start Date End Date Taking? Authorizing Provider  divalproex (DEPAKOTE) 500 MG DR tablet Take 1,000 mg by mouth 2 (two) times daily.    [provider]  etodolac (LODINE) 300 MG capsule Take 1 capsule (300 mg total) by mouth every 8 (eight) hours. 06/16/18   Dorie Rank, MD  oxyCODONE-acetaminophen (PERCOCET/ROXICET) 5-325 MG tablet Take 1 tablet by mouth every 4 (four) hours as needed for severe pain. 05/24/18   Dowless, Aldona Bar Tripp, PA-C  traMADol (ULTRAM) 50 MG tablet Take 50 mg by mouth every 6 (six) hours as needed for pain. 08/16/15   [provider]    Allergies    Erythromycin,  Latex, and Cleocin [clindamycin]  Review of Systems   Review of Systems  Constitutional: Negative for fever.  HENT: Negative for facial swelling.   Respiratory: Negative for shortness of breath.   Gastrointestinal: Negative for nausea.  Musculoskeletal: Negative for myalgias.  Skin: Positive for rash.    Physical Exam Updated Vital Signs BP (!) 151/97 (BP Location: Right Arm)   Pulse (!) 105   Temp 98.7 F (37.1 C) (Oral)   Resp 17   Ht 5\' 3"  (1.6 m)   Wt 66.2 kg   SpO2 96%   BMI 25.86 kg/m   Physical Exam Vitals and nursing note reviewed.  Constitutional:      Comments: Uncomfortable appearing.   HENT:     Head: Normocephalic.  Pulmonary:     Effort: Pulmonary effort is normal.  Musculoskeletal:        General: Normal range of motion.  Skin:    General: Skin is warm and dry.     Comments: There is an erythematous, confluent rash within the hairline of scalp. There are areas of serosanguinous fluid drainage. No purulence.   Neurological:     Mental Status: She is alert and oriented to person, place, and time.     ED Results / Procedures / Treatments   Labs (all labs ordered are listed, but only abnormal results are displayed)  Labs Reviewed - No data to display  EKG None  Radiology No results found.  Procedures Procedures (including critical care time)  Medications Ordered in ED Medications  dexamethasone (DECADRON) injection 10 mg (has no administration in time range)  diphenhydrAMINE (BENADRYL) injection 25 mg (has no administration in time range)  famotidine (PEPCID) IVPB 20 mg premix (has no administration in time range)  HYDROcodone-acetaminophen (NORCO/VICODIN) 5-325 MG per tablet 1 tablet (has no administration in time range)    ED Course  I have reviewed the triage vital signs and the nursing notes.  Pertinent labs & imaging results that were available during my care of the patient were reviewed by me and considered in my medical decision  making (see chart for details).    MDM Rules/Calculators/A&P                      Patient to ED with allergic reaction to hair coloring. No secondary infection felt present. Decadron provided in the ED. Recommend benadryl and pepcid for home use. Limited number of Norco provided for pain.   Final Clinical Impression(s) / ED Diagnoses Final diagnoses:  None   1. Allergic reaction  Rx / DC Orders ED Discharge Orders    None       Danne Harbor 04/03/20 0732    Dione Booze, MD 04/05/20 [phone]

## 2020-04-03 NOTE — Discharge Instructions (Addendum)
Recommend Benadryl 25 mg every 6 hours. Alternatively, you could use Claritin every 12 hours. Take Pepcid twice daily. You have been given a few Norco for pain relief.   If symptoms persist, follow up with your doctor for further evaluation.

## 2020-04-03 NOTE — ED Notes (Signed)
Patient unable to sign for discharge paperwork as patient is assigned to hallway bed. This RN went over discharge with patient and patient verbalized understanding of discharge.
